# Patient Record
Sex: Male | Born: 1970 | Hispanic: Yes | Smoking: Former smoker
Health system: Southern US, Community
[De-identification: ages and names within clinical notes are randomized; demographics above are authoritative.]

---

## 2018-12-05 ENCOUNTER — Other Ambulatory Visit: Payer: Self-pay

## 2018-12-05 ENCOUNTER — Emergency Department: Payer: Self-pay

## 2018-12-05 DIAGNOSIS — R42 Dizziness and giddiness: Secondary | ICD-10-CM | POA: Insufficient documentation

## 2018-12-05 DIAGNOSIS — R079 Chest pain, unspecified: Secondary | ICD-10-CM | POA: Insufficient documentation

## 2018-12-05 DIAGNOSIS — Z87891 Personal history of nicotine dependence: Secondary | ICD-10-CM | POA: Insufficient documentation

## 2018-12-05 DIAGNOSIS — Z20828 Contact with and (suspected) exposure to other viral communicable diseases: Secondary | ICD-10-CM | POA: Insufficient documentation

## 2018-12-05 LAB — BASIC METABOLIC PANEL
Anion gap: 12 (ref 5–15)
BUN: 16 mg/dL (ref 6–20)
CO2: 24 mmol/L (ref 22–32)
Calcium: 9.2 mg/dL (ref 8.9–10.3)
Chloride: 103 mmol/L (ref 98–111)
Creatinine, Ser: 0.87 mg/dL (ref 0.61–1.24)
GFR calc Af Amer: >60 mL/min (ref >60)
GFR calc non Af Amer: >60 mL/min (ref >60)
Glucose, Bld: 117 mg/dL — ABNORMAL HIGH (ref 70–99)
Potassium: 3.7 mmol/L (ref 3.5–5.1)
Sodium: 139 mmol/L (ref 135–145)

## 2018-12-05 LAB — CBC
HCT: 42.1 % (ref 39.0–52.0)
Hemoglobin: 14.2 g/dL (ref 13.0–17.0)
MCH: 28.6 pg (ref 26.0–34.0)
MCHC: 33.7 g/dL (ref 30.0–36.0)
MCV: 84.9 fL (ref 80.0–100.0)
Platelets: 286 10*3/uL (ref 150–400)
RBC: 4.96 MIL/uL (ref 4.22–5.81)
RDW: 12.8 % (ref 11.5–15.5)
WBC: 14 10*3/uL — ABNORMAL HIGH (ref 4.0–10.5)
nRBC: 0 % (ref 0.0–0.2)

## 2018-12-05 LAB — TROPONIN I (HIGH SENSITIVITY): Troponin I (High Sensitivity): 4 ng/L (ref <18)

## 2018-12-05 NOTE — ED Triage Notes (Addendum)
Patient c/o 'weird feeling' beginning this am at 0500 after awakening.  Patient reports malaise and dizziness throughout the day. Patient c/o chest left chest pain radiating to neck, palpitations and dizziness beginning this evening. Patient reports intermittent cough.

## 2018-12-06 ENCOUNTER — Emergency Department
Admission: EM | Admit: 2018-12-06 | Discharge: 2018-12-06 | Disposition: A | Discharge status: Home / Self Care | Payer: Self-pay | Attending: Emergency Medicine | Admitting: Emergency Medicine

## 2018-12-06 ENCOUNTER — Emergency Department: Payer: Self-pay

## 2018-12-06 DIAGNOSIS — R059 Cough, unspecified: Secondary | ICD-10-CM

## 2018-12-06 DIAGNOSIS — R079 Chest pain, unspecified: Secondary | ICD-10-CM

## 2018-12-06 DIAGNOSIS — R05 Cough: Secondary | ICD-10-CM

## 2018-12-06 DIAGNOSIS — R42 Dizziness and giddiness: Secondary | ICD-10-CM

## 2018-12-06 LAB — TROPONIN I (HIGH SENSITIVITY): Troponin I (High Sensitivity): 4 ng/L (ref <18)

## 2018-12-06 MED ORDER — MECLIZINE HCL 25 MG PO TABS
25.0000 mg | ORAL_TABLET | Freq: Once | ORAL | Status: AC
  Administered 2018-12-06: 25 mg via ORAL
  Filled 2018-12-06: qty 1

## 2018-12-06 MED ORDER — MECLIZINE HCL 25 MG PO TABS: 25.0000 mg | ORAL_TABLET | Freq: Three times a day (TID) | ORAL | 0 refills | Status: AC | PRN

## 2018-12-06 NOTE — ED Notes (Signed)
Pt reports chest pain that started yesterday morning to his left chest, radiating to his neck, states he felt like his heart was racing. C/o lower back pain that started 3-4 days ago as well. Denies N/V. Reports hx of acid reflux.  Pt in NAD at this time. Wife at bedside

## 2018-12-06 NOTE — Discharge Instructions (Addendum)

## 2018-12-06 NOTE — ED Provider Notes (Signed)
[facility] Emergency Department Provider Note  ____________________________________________  Time seen: Approximately 4:29 AM  I have reviewed the triage vital signs and the nursing notes.   HISTORY  Chief Complaint Chest Pain   HPI Austin Shields is a 48 y.o. male with no significant past medical history who presents for evaluation of several complaints.  Patient reports that he woke up yesterday morning not feeling well.  Has a cough which is productive of yellow sputum and body aches.  When he was bending down trying to get ready for work he had an episode of vertigo which she had never had before.  Once he stood up and sat down the vertical resolved.  Throughout the day every time patient bend over he had this episode of vertigo which was short-lived.  He came home from work again not feeling very well with diffuse body aches.  While laying in bed he started having chest pain that he describes  as pressure in the center of his chest.  That was associated with palpitations.  He reports that he was able to feel his heart pulsating in his neck.  He denies any personal or family history of heart attacks, strokes, blood clots.  He is a former smoker and stopped 10 years ago.  He denies any leg pain or swelling, hemoptysis or exogenous hormones.  No recent travel immobilization.  Chest pain has now resolved.  He denies alert speech, facial droop, unilateral weakness or numbness.  PMH None -reviewed  Prior to Admission medications   Medication Sig Start Date End Date Taking? Authorizing Provider  meclizine (ANTIVERT) 25 MG tablet Take 1 tablet (25 mg total) by mouth 3 (three) times daily as needed for dizziness. 12/06/18   Nita Sickle, MD    Allergies Patient has no known allergies.  No family history on file.  Social History Social History   Tobacco Use   Smoking status: Former Smoker   Smokeless tobacco: Never Used  Substance Use  Topics   Alcohol use: Yes   Drug use: Not on file    Review of Systems  Constitutional: Negative for fever. + body aches Eyes: Negative for visual changes. ENT: Negative for sore throat. Neck: No neck pain  Cardiovascular: + chest pain. Respiratory: Negative for shortness of breath. + cough Gastrointestinal: Negative for abdominal pain, vomiting or diarrhea. Genitourinary: Negative for dysuria. Musculoskeletal: Negative for back pain. Skin: Negative for rash. Neurological: Negative for headaches, weakness or numbness. + vertigo Psych: No SI or HI  ____________________________________________   PHYSICAL EXAM:  VITAL SIGNS: ED Triage Vitals  Enc Vitals Group     BP 12/05/18 2109 (!) 159/104     Pulse Rate 12/05/18 2109 (!) 111     Resp 12/05/18 2109 18     Temp 12/05/18 2109 98.9 F (37.2 C)     Temp src --      SpO2 12/05/18 2109 96 %     Weight 12/05/18 2120 205 lb (93 kg)     Height 12/05/18 2120 6' 0.44" (1.84 m)     Head Circumference --      Peak Flow --      Pain Score 12/05/18 2119 8     Pain Loc --      Pain Edu? --      Excl. in GC? --     Constitutional: Alert and oriented. Well appearing and in no apparent distress. HEENT:      Head: Normocephalic and atraumatic.  Eyes: Conjunctivae are normal. Sclera is non-icteric.       Mouth/Throat: Mucous membranes are moist.       Neck: Supple with no signs of meningismus. Cardiovascular: Regular rate and rhythm. No murmurs, gallops, or rubs. 2+ symmetrical distal pulses are present in all extremities. No JVD. Respiratory: Normal respiratory effort. Lungs are clear to auscultation bilaterally. No wheezes, crackles, or rhonchi.  Gastrointestinal: Soft, non tender, and non distended with positive bowel sounds. No rebound or guarding. Genitourinary: No CVA tenderness. Musculoskeletal: Nontender with normal range of motion in all extremities. No edema, cyanosis, or erythema of extremities. Neurologic:  Normal speech and language. A & O x3, PERRL, EOMI, no nystagmus, CN II-XII intact, motor testing reveals good tone and bulk throughout. There is no evidence of pronator drift or dysmetria. Muscle strength is 5/5 throughout. Deep tendon reflexes are 2+ throughout with downgoing toes. Sensory examination is intact. Gait is normal. Skin: Skin is warm, dry and intact. No rash noted. Psychiatric: Mood and affect are normal. Speech and behavior are normal.  ____________________________________________   LABS (all labs ordered are listed, but only abnormal results are displayed)  Labs Reviewed  BASIC METABOLIC PANEL - Abnormal; Notable for the following components:      Result Value   Glucose, Bld 117 (*)    All other components within normal limits  CBC - Abnormal; Notable for the following components:   WBC 14.0 (*)    All other components within normal limits  NOVEL CORONAVIRUS, NAA (HOSP ORDER, SEND-OUT TO REF LAB; TAT 18-24 HRS)  TROPONIN I (HIGH SENSITIVITY)  TROPONIN I (HIGH SENSITIVITY)   ____________________________________________  EKG  ED ECG REPORT I, Nita Sicklearolina Chaos Carlile, the attending physician, personally viewed and interpreted this ECG.  Sinus tachycardia, rate of 119, normal intervals, incomplete RBBB, right axis deviation, no ST elevations or depressions.  No prior EKG available. ____________________________________________  RADIOLOGY  I have personally reviewed the images performed during this visit and I agree with the Radiologist's read.   Interpretation by Radiologist:  Dg Chest 2 View  Result Date: 12/05/2018 CLINICAL DATA:  Chest pain EXAM: CHEST - 2 VIEW COMPARISON:  None. FINDINGS: No consolidation, features of edema, pneumothorax, or effusion. Pulmonary vascularity is normally distributed. The cardiomediastinal contours are unremarkable. No acute osseous or soft tissue abnormality. IMPRESSION: No acute cardiopulmonary abnormality. Electronically Signed   By:  Kreg ShropshirePrice  DeHay M.D.   On: 12/05/2018 21:49   Ct Head Wo Contrast  Result Date: 12/06/2018 CLINICAL DATA:  Chest pain, vertigo EXAM: CT HEAD WITHOUT CONTRAST TECHNIQUE: Contiguous axial images were obtained from the base of the skull through the vertex without intravenous contrast. COMPARISON:  None. FINDINGS: Brain: No acute intracranial abnormality. Specifically, no hemorrhage, hydrocephalus, mass lesion, acute infarction, or significant intracranial injury. Vascular: No hyperdense vessel or unexpected calcification. Skull: No acute calvarial abnormality. Sinuses/Orbits: Mucosal thickening diffusely throughout the paranasal sinuses. Other: None IMPRESSION: No intracranial abnormality. Chronic sinusitis. Electronically Signed   By: Charlett NoseKevin  Dover M.D.   On: 12/06/2018 03:32     ____________________________________________   PROCEDURES  Procedure(s) performed: None Procedures Critical Care performed:  None ____________________________________________   INITIAL IMPRESSION / ASSESSMENT AND PLAN / ED COURSE   [age] y.o. male with no significant past medical history who presents for evaluation of several complaints including vertigo, body aches, cough, CP.  Patient is extremely well-appearing and in no distress.  Has had a cough and body aches therefore we tested him for Covid here.  He has no  increased work of breathing and normal sats, his chest x-ray is normal.  His EKG shows no evidence of ischemia or dysrhythmias.  Also complaining of paroxysmal positional vertigo that started this morning.  CT of the head was done showing no acute abnormalities.  Patient is completely neurologically intact with normal gait.  Low suspicion for central cause.  CT did show chronic sinusitis that together with a viral illness could be causing patient to have vertigo.  I started him on meclizine and recommended sinus lavage.  Discussed return precautions for any signs of central vertigo including headache or stroke like  symptoms.  Labs did not show any evidence of anemia, AKI, significant electrolyte abnormalities.  Patient did have a mild leukocytosis which is most likely due to a viral illness.  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  Looked dry on exam.  His tachycardia resolved with oral hydration.       As part of my medical decision making, I reviewed the following data within the [facility] notes reviewed and incorporated, Labs reviewed , EKG interpreted , Old EKG reviewed, Old chart reviewed, Radiograph reviewed , Notes from prior ED visits and [place] Controlled Substance Database   Please note:  Patient was evaluated in Emergency Department today for the symptoms described in the history of present illness. Patient was evaluated in the context of the global COVID-19 pandemic, which necessitated consideration that the patient might be at risk for infection with the SARS-CoV-2 virus that causes COVID-19. Institutional protocols and algorithms that pertain to the evaluation of patients at risk for COVID-19 are in a state of rapid change based on information released by regulatory bodies including the CDC and federal and state organizations. These policies and algorithms were followed during the patient's care in the ED.  Some ED evaluations and interventions may be delayed as a result of limited staffing during the pandemic.   ____________________________________________   FINAL CLINICAL IMPRESSION(S) / ED DIAGNOSES   Final diagnoses:  Vertigo  Chest pain, unspecified type  Cough      NEW MEDICATIONS STARTED DURING THIS VISIT:  ED Discharge Orders         Ordered    meclizine (ANTIVERT) 25 MG tablet  3 times daily PRN     12/06/18 0429           Note:  This document was prepared using Dragon voice recognition software and may include unintentional dictation errors.    Alfred Levins, [place], MD 12/06/18 [phone]

## 2018-12-06 NOTE — ED Notes (Signed)
Interpreter # [phone] used.

## 2018-12-08 LAB — NOVEL CORONAVIRUS, NAA (HOSP ORDER, SEND-OUT TO REF LAB; TAT 18-24 HRS): SARS-CoV-2, NAA: NOT DETECTED

## 2019-07-15 ENCOUNTER — Telehealth: Payer: Self-pay | Admitting: General Practice

## 2019-07-15 NOTE — Telephone Encounter (Signed)
Call could not be completed on three separate occasions therefore no attempt to contact from [facility] will be made anymore

## 2021-02-27 IMAGING — CT CT HEAD W/O CM
3 series · 16 of 46 positions shown, 19 images · non-contrast
Comparison: None.

CLINICAL DATA: Chest pain, vertigo

EXAM:
CT HEAD WITHOUT CONTRAST
TECHNIQUE: Contiguous axial images were obtained from the base of the skull
through the vertex without intravenous contrast.

[Series 3: head wo · axial · 0.44mm/px · z∈[-63,+57]mm · 10 of 29 slices shown, 13 images]
[im 3/29  brain]
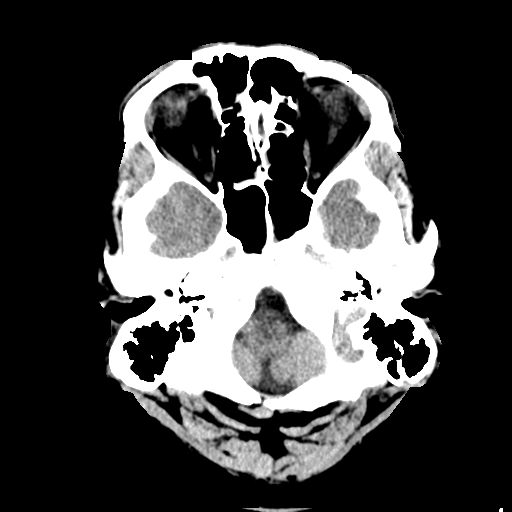
[im 3/29  bone]
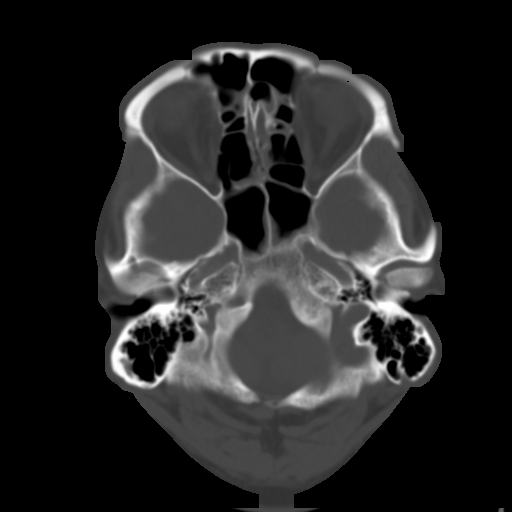
[im 6/29  brain]
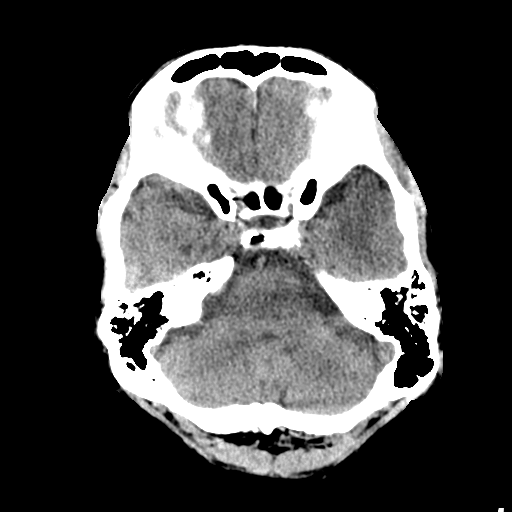
[im 8/29  brain]
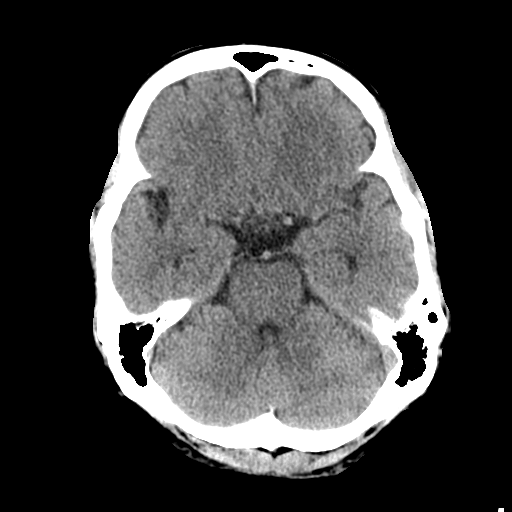
[im 11/29  brain]
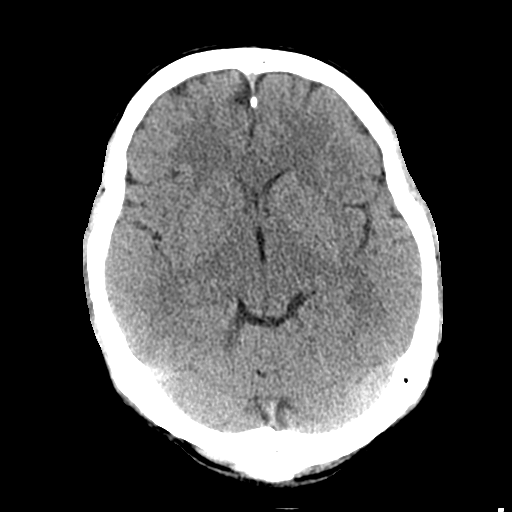
[im 14/29  brain]
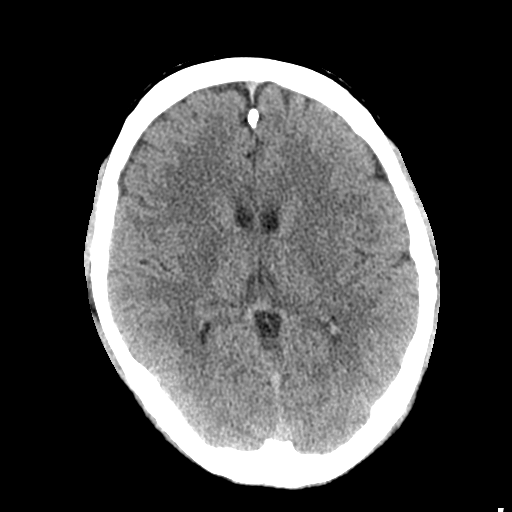
[im 14/29  bone]
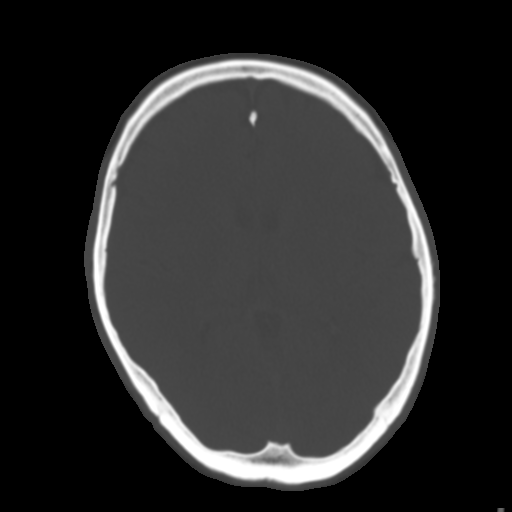
[im 16/29  brain]
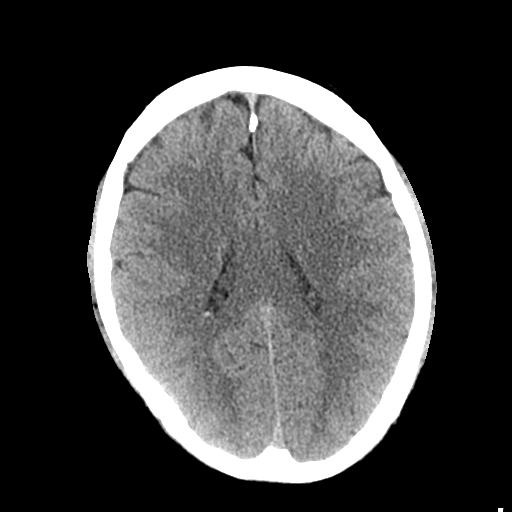
[im 19/29  brain]
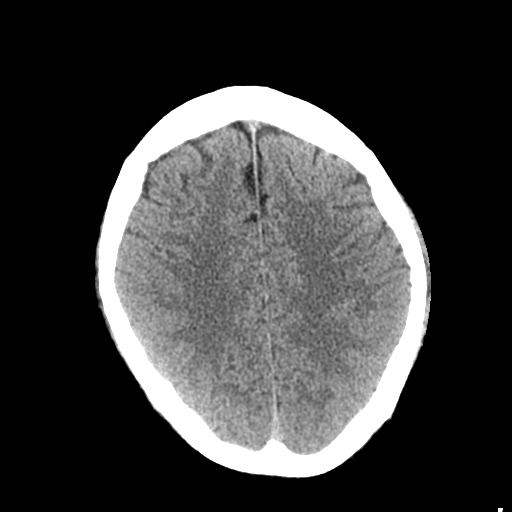
[im 22/29  brain]
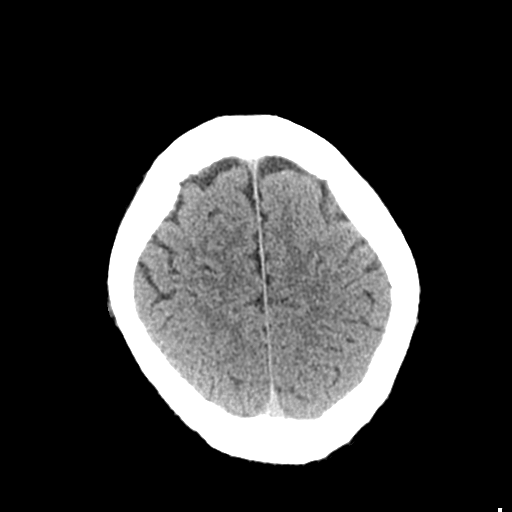
[im 24/29  brain]
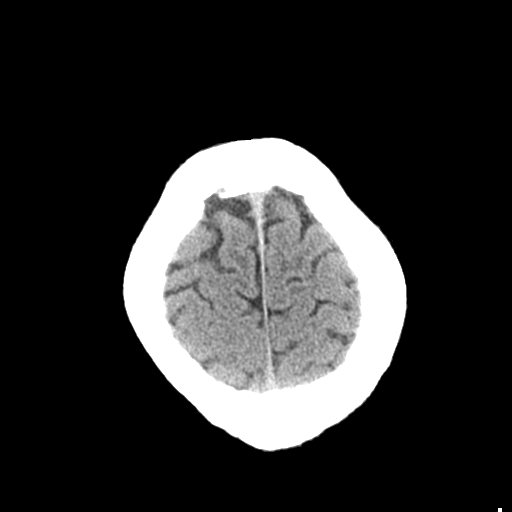
[im 24/29  bone]
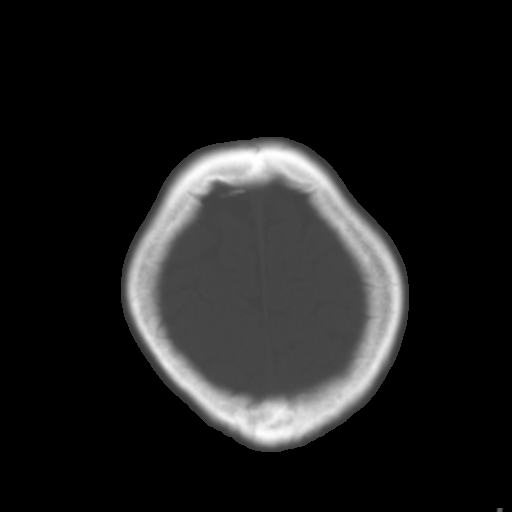
[im 27/29  brain]
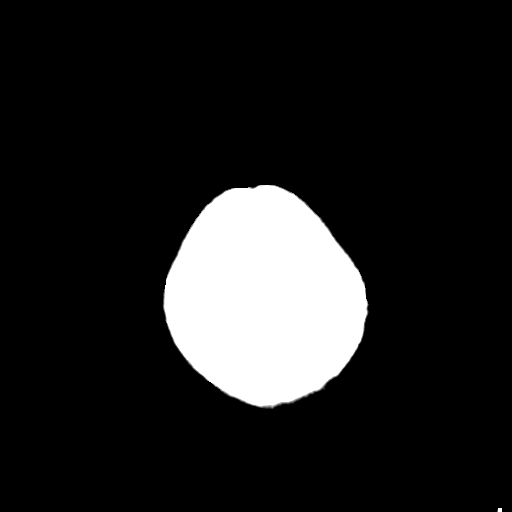

[Series 4: coronal soft tissue · coronal · 0.31mm/px · 3 of 65 slices shown]
[im 22/65  brain]
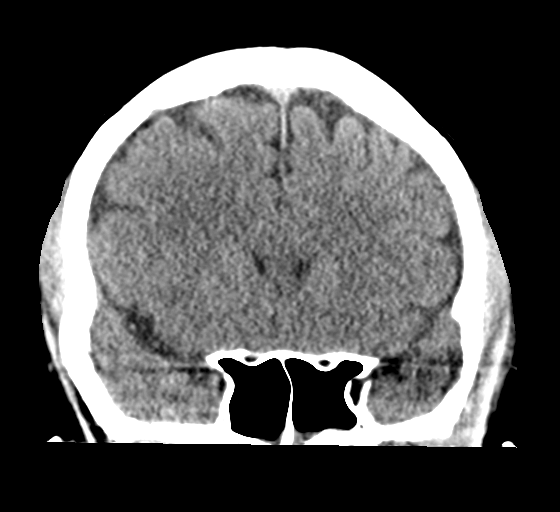
[im 29/65  brain]
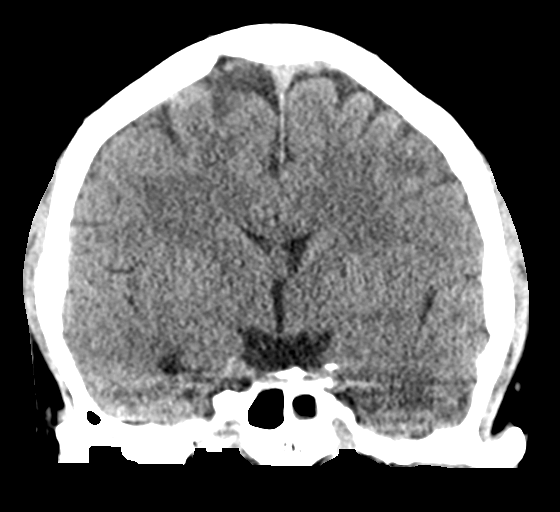
[im 36/65  brain]
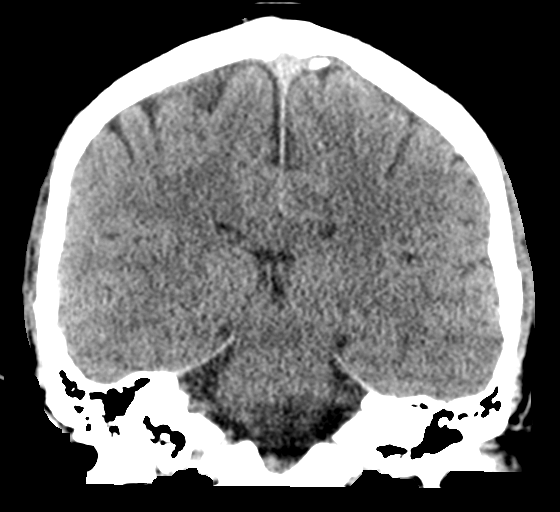

[Series 5: sagittal soft tissue · sagittal · 0.31mm/px · 3 of 55 slices shown]
[im 19/55  brain]
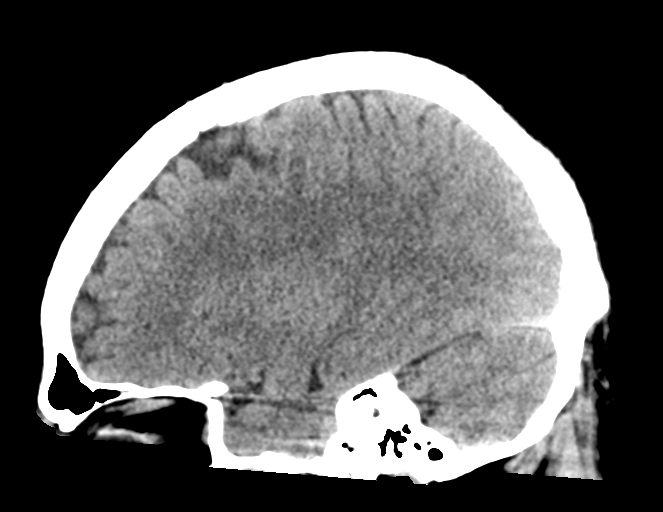
[im 28/55  brain]
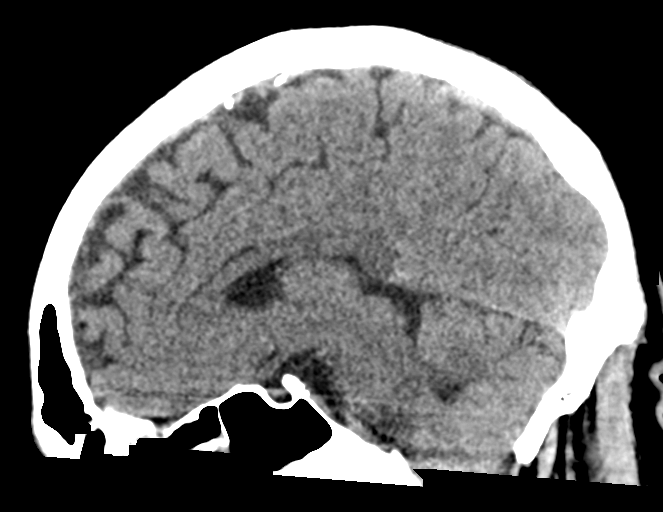
[im 37/55  brain]
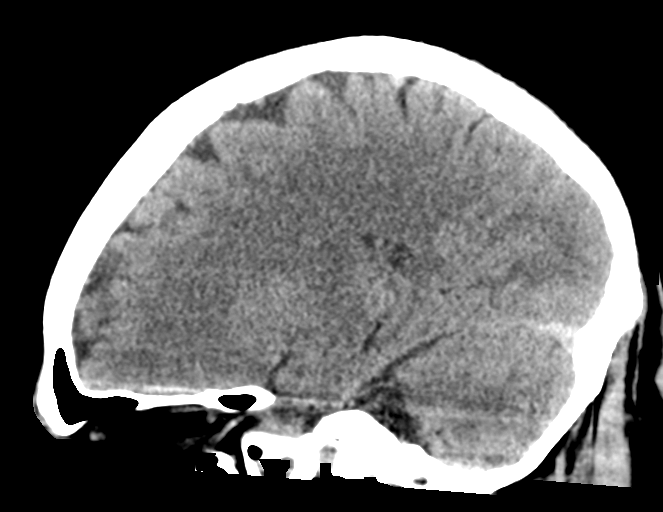

[16 of 46 positions shown; findings below may reference images not displayed]

FINDINGS: Brain: No acute intracranial abnormality. Specifically, no
hemorrhage, hydrocephalus, mass lesion, acute infarction, or
significant intracranial injury.

Vascular: No hyperdense vessel or unexpected calcification.

Skull: No acute calvarial abnormality.

Sinuses/Orbits: Mucosal thickening diffusely throughout the
paranasal sinuses.

Other: None
IMPRESSION: No intracranial abnormality.

Chronic sinusitis.
